# Patient Record
Sex: Female | Born: 1995 | Race: White | Hispanic: No | Marital: Single | Smoking: Never smoker
Health system: Southern US, Community
[De-identification: ages and names within clinical notes are randomized; demographics above are authoritative.]

## PROBLEM LIST (undated history)

## (undated) DIAGNOSIS — M419 Scoliosis, unspecified: Secondary | ICD-10-CM

## (undated) HISTORY — DX: Scoliosis, unspecified: M41.9

---
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
## 2016-01-03 ENCOUNTER — Ambulatory Visit (INDEPENDENT_AMBULATORY_CARE_PROVIDER_SITE_OTHER): Payer: BLUE CROSS/BLUE SHIELD | Admitting: Family

## 2016-01-03 ENCOUNTER — Encounter: Payer: Self-pay | Admitting: Family

## 2016-01-03 VITALS — Wt 167.0 lb

## 2016-01-03 DIAGNOSIS — B2790 Infectious mononucleosis, unspecified without complication: Secondary | ICD-10-CM | POA: Diagnosis not present

## 2016-01-03 MED ORDER — AMOXICILLIN 500 MG PO CAPS: 500.0000 mg | ORAL_CAPSULE | Freq: Two times a day (BID) | ORAL | Status: DC

## 2016-01-03 NOTE — Patient Instructions (Signed)
- Can start back exercise on Monday. Take it easy   - No contact activities  - If you develop fever, call immediately.  - Surfing is ok.   Infectious Mononucleosis Infectious mononucleosis is an infection caused by a virus. This illness is often called "mono." It causes symptoms that affect various areas of the body, including the throat, upper air passages, and lymph glands. The liver or spleen may also be affected. The virus spreads from person to person through close contact. The illness is usually not serious and often goes away in 2-4 weeks without treatment. In rare cases, symptoms can be more severe and last longer, sometimes up to several months. Because the illness can sometimes cause the liver or spleen to become enlarged, you should not participate in contact sports or strenuous exercise until your health care provider approves. CAUSES  Infectious mononucleosis is caused by the Epstein-Barr virus. This virus spreads through contact with an infected person's saliva or other bodily fluids. It is often spread through kissing. It may also spread through coughing or sharing utensils or drinking glasses that were recently used by an infected person. An infected person will not always appear ill but can still spread the virus. RISK FACTORS This illness is most common in adolescents and young adults. SIGNS AND SYMPTOMS  The most common symptoms of infectious mononucleosis are:  Sore throat.   Headache.   Fatigue.   Muscle aches.   Swollen glands.   Fever.   Poor appetite.   Enlarged liver or spleen.  Some less common symptoms that can also occur include:  Rash. This is more common if you take antibiotic medicines.  Feeling sick to your stomach (nauseous).   Abdominal pain.  DIAGNOSIS  Your health care provider will take your medical history and do a physical exam. Blood tests can be done to confirm the diagnosis.  TREATMENT  Infectious mononucleosis usually goes  away on its own with time. It cannot be cured with medicines, but medicines are sometimes used to relieve symptoms. Steroid medicine is sometimes needed if the swelling in the throat causes breathing or swallowing problems. Treatment in a hospital is sometimes needed for severe cases.  HOME CARE INSTRUCTIONS   Rest as needed.   Do not participate in contact sports, strenuous exercise, or heavy lifting until your health care provider approves. The liver and spleen could be seriously injured if they are enlarged from the illness. You may need to wait a couple months before participating in sports.   Drink enough fluid to keep your urine clear or pale yellow.   Do not drink alcohol.  Take medicines only as directed by your health care provider. Children under 1 years of age should not take aspirin because of the association with Reye syndrome.   Eat soft foods. Cold foods such as ice cream or frozen ice pops can soothe a sore throat.  If you have a sore throat, gargle with a mixture of salt and water. This may help relieve your discomfort. Mix 1 tsp of salt in 1 cup of warm water. Sucking on hard candy may also help.   Start regular activities gradually after the fever is gone. Be sure to rest when tired.   Avoid kissing or sharing utensils or drinking glasses until your health care provider tells you that you are no longer contagious.  PREVENTION  To avoid spreading the virus, do not kiss anyone or share utensils, drinking glasses, or food until your health care provider  tells you that you are no longer contagious. SEEK MEDICAL CARE IF:   Your fever is not gone after 10 days.  You have swollen lymph nodes that are not back to normal after 4 weeks.  Your activity level is not back to normal after 2 months.   You have yellow coloring to your eyes and skin (jaundice).  You have constipation.  SEEK IMMEDIATE MEDICAL CARE IF:   You have severe pain in the abdomen or  shoulder.  You are drooling.  You have trouble swallowing.  You have trouble breathing.  You develop a stiff neck.  You develop a severe headache.  You cannot stop throwing up (vomiting).  You have convulsions.  You are confused.  You have trouble with balance.  You have signs of dehydration. These may include:  Weakness.  Sunken eyes.  Pale skin.  Dry mouth.  Rapid breathing or pulse.   This information is not intended to replace advice given to you by your health care provider. Make sure you discuss any questions you have with your health care provider.   Document Released: 07/17/2000 Document Revised: 08/10/2014 Document Reviewed: 03/27/2014 Elsevier Interactive Patient Education [employer]2016 Elsevier Inc.

## 2016-01-03 NOTE — Progress Notes (Signed)
Subjective:     Patient ID: Brooke Leonard, female   DOB: 1996/02/25, 20 y.o.   MRN: [id number]  HPI 20 y.o. Female presents for follow up after being diagnosed with Mononucleosis two weeks ago. She states that she is feeling "so much better".  She reports that her energy is much better and she is not feeling congested at all. She has been resting and drinking lots of fluids, she is not playing Volleyball at this time. She denies fever, fatigue, SOB, abdominal pain and change in appetite. She leaves for [place] for three weeks on Saturday.    Review of Systems  Constitutional: Negative.  Negative for fever, diaphoresis, appetite change and fatigue.  HENT: Negative.  Negative for sinus pressure and sore throat.   Eyes: Negative.   Respiratory: Negative.  Negative for cough, chest tightness, shortness of breath and wheezing.   Cardiovascular: Negative.  Negative for chest pain and palpitations.  Gastrointestinal: Negative.  Negative for nausea, vomiting, abdominal pain, diarrhea, constipation and abdominal distention.  Endocrine: Negative.   Musculoskeletal: Negative.   Skin: Negative.   Neurological: Negative.  Negative for dizziness, weakness, light-headedness, numbness and headaches.  Hematological: Negative.  Negative for adenopathy.   Past Medical History  Diagnosis Date  . Scoliosis     5 degree curve    Social History   Social History  . Marital Status: Single    Spouse Name: N/A  . Number of Children: N/A  . Years of Education: N/A   Occupational History  . Not on file.   Social History Main Topics  . Smoking status: Never Smoker   . Smokeless tobacco: Never Used  . Alcohol Use: No  . Drug Use: No  . Sexual Activity: No   Other Topics Concern  . Not on file   Social History Narrative   [employer]   12th grade   Volleyball   Applying to college    No past surgical history on file.  Family History  Problem Relation Age of Onset  .  Diabetes Maternal Grandfather     type 1  . Heart disease Maternal Grandfather   . Heart disease Maternal Uncle     IHSS  . Early death Maternal Uncle 24  . Hypertrophic cardiomyopathy Maternal Uncle   . Hyperlipidemia Father   . Cancer Paternal Grandfather     colon, prostate, liver  . Asthma Neg Hx   . Depression Neg Hx   . Hypertension Neg Hx   . Kidney disease Neg Hx   . Thyroid disease Neg Hx   . Seizures Neg Hx   . Mental illness Neg Hx   . Alcohol abuse Neg Hx   . Drug abuse Neg Hx   . Arthritis Maternal Grandmother     No Known Allergies  Current Outpatient Prescriptions on File Prior to Visit  Medication Sig Dispense Refill  . amoxicillin (AMOXIL) 500 MG capsule Take 1 capsule (500 mg total) by mouth 2 (two) times daily. 20 capsule 0  . fluticasone (FLONASE) 50 MCG/ACT nasal spray Place 2 sprays into both nostrils daily. 16 g 2  . hydrOXYzine (ATARAX/VISTARIL) 25 MG tablet Take 1 tablet (25 mg total) by mouth 3 (three) times daily as needed. 30 tablet 0  . Ivermectin 0.5 % LOTN Apply 1 application topically once. 1 Tube 0  . silver sulfADIAZINE (SILVADENE) 1 % cream Apply 1 application topically daily. 50 g 2   No current facility-administered medications on file prior to  visit.    Wt 167 lb (75.751 kg)chart     Objective:   Physical Exam  Constitutional: She is active.  Neck: Trachea normal, normal range of motion and full passive range of motion without pain. Neck supple.  Cardiovascular: Normal rate, regular rhythm and normal pulses.   Pulmonary/Chest: Effort normal and breath sounds normal. She has no decreased breath sounds. She has no wheezes. She has no rhonchi. She has no rales.  Abdominal: Normal appearance and bowel sounds are normal. There is no hepatosplenomegaly, splenomegaly or hepatomegaly. There is no tenderness. There is no CVA tenderness.  Lymphadenopathy:    She has no cervical adenopathy.  Neurological: She is alert.  Skin: Skin is warm,  dry and intact.       Assessment:     Mononucleosis      Plan:     - May start back light activity. No contact.  - If develop fever, fatigue or abdominal pain needs to call clinic immediately.  - Tylenol or Ibuprofen for pain/fever

## 2017-01-02 ENCOUNTER — Encounter: Payer: Self-pay | Admitting: Pediatrics

## 2017-01-02 ENCOUNTER — Ambulatory Visit (INDEPENDENT_AMBULATORY_CARE_PROVIDER_SITE_OTHER): Payer: 59 | Admitting: Pediatrics

## 2017-01-02 VITALS — Wt 164.5 lb

## 2017-01-02 DIAGNOSIS — J019 Acute sinusitis, unspecified: Secondary | ICD-10-CM | POA: Diagnosis not present

## 2017-01-02 DIAGNOSIS — B9689 Other specified bacterial agents as the cause of diseases classified elsewhere: Secondary | ICD-10-CM | POA: Diagnosis not present

## 2017-01-02 MED ORDER — AMOXICILLIN 500 MG PO CAPS: 500.0000 mg | ORAL_CAPSULE | Freq: Two times a day (BID) | ORAL | 0 refills | Status: AC

## 2017-01-02 MED ORDER — HYDROXYZINE HCL 25 MG PO TABS: 25.0000 mg | ORAL_TABLET | Freq: Three times a day (TID) | ORAL | 0 refills | Status: AC | PRN

## 2017-01-02 NOTE — Patient Instructions (Signed)

## 2017-01-02 NOTE — Progress Notes (Signed)
21 year old female who presents for evaluation of cough and congestion for 16 days. Symptoms include: congestion, cough, mouth breathing, nasal congestion,  and snoring. Onset of symptoms was 16 days ago. Symptoms have been gradually worsening since that time.   The following portions of the patient's history were reviewed and updated as appropriate: allergies, current medications, past family history, past medical history, past social history, past surgical history and problem list.  Review of Systems Pertinent items are noted in HPI.    Objective:     General Appearance:    Alert, cooperative, no distress.  Head:    Normocephalic, without obvious abnormality, atraumatic  Eyes:    PERRL, conjunctiva/corneas clear  Ears:    Normal TM's and external ear canals, both ears with fluid in situ  Nose:   Nares normal, septum midline, mucosa red and swollen with mucoid drainage     Throat:   Lips, mucosa, and tongue normal; teeth and gums normal        Lungs:     Clear to auscultation bilaterally, respirations unlabored  Chest wall:    No tenderness or deformity  Heart:    Regular rate and rhythm, S1 and S2 normal, no murmur, rub   or gallop  Abdomen:     Soft, non-tender, bowel sounds active all four quadrants,    no masses, no organomegaly        Extremities:   Extremities normal, atraumatic, no cyanosis or edema     Skin:   Skin color, texture, turgor normal, no rashes or lesions  Lymph nodes:   Cervical, supraclavicular, and axillary nodes normal  Neurologic:   Normal strength, sensation and reflexes      throughout      Assessment:    Acute bacterial sinusitis.    Plan:    Nasal saline sprays. Antihistamines per medication orders. Amoxicillin per medication orders.

## 2017-01-03 DIAGNOSIS — J019 Acute sinusitis, unspecified: Principal | ICD-10-CM

## 2017-01-03 DIAGNOSIS — B9689 Other specified bacterial agents as the cause of diseases classified elsewhere: Secondary | ICD-10-CM | POA: Insufficient documentation

## 2017-03-23 ENCOUNTER — Telehealth: Payer: Self-pay | Admitting: Pediatrics

## 2017-03-23 NOTE — Telephone Encounter (Signed)
Brooke Leonard passed out Friday  And would like to talk to you again about her low sugar problems please

## 2017-03-24 NOTE — Telephone Encounter (Signed)
Spoke to patient and  Advised her that there are other causes of fainting other than low blood sugar. She has to make sure her salt intake is good and she is drinking a lot of fluids to keep hydrated. She can come in if these episodes are more frequent (she only had this second one in 5 years) so we can check blood pressure and run any needed blood tests. She expressed understanding.

## 2020-03-07 NOTE — ED Notes (Signed)
ED Triage Note       ED Secondary Triage Entered On:  03/07/2020 22:46 EDT    Performed On:  03/07/2020 22:45 EDT by Claretha Cooper, RN, Noberto Retort               General Information   Barriers to Learning :   None evident   ED Home Meds Section :   Document assessment   [facility] ED Fall Risk Section :   Document assessment   ED Advance Directives Section :   Document assessment   ED Palliative Screen :   N/A (prefilled for <65yo)   Claretha Cooper, RN, Erin K - 03/07/2020 22:45 EDT   (As Of: 03/07/2020 22:46:20 EDT)   Diagnoses(Active)    Abdominal pain  Date:   03/07/2020 ; Diagnosis Type:   Reason For Visit ; Confirmation:   Complaint of ; Clinical Dx:   Abdominal pain ; Classification:   Medical ; Clinical Service:   Emergency medicine ; Code:   PNED ; Probability:   0 ; Diagnosis Code:   [id number]             -    Procedure History   (As Of: 03/07/2020 22:46:20 EDT)     Phoebe Perch Fall Risk Assessment Tool   Hx of falling last 3 months ED Fall :   No   Patient confused or disoriented ED Fall :   No   Patient intoxicated or sedated ED Fall :   No   Patient impaired gait ED Fall :   No   Use a mobility assistance device ED Fall :   No   Patient altered elimination ED Fall :   No   [facility] ED Fall Score :   0    Aviva Kluver - 03/07/2020 22:45 EDT   ED Advance Directive   Advance Directive :   Yes   Type of Advance Directive :   Medical durable power of attorney   Aviva Kluver - 03/07/2020 22:45 EDT

## 2020-03-07 NOTE — ED Provider Notes (Signed)
Abdominal Pain *ED        Patient:   Debbie Farley, Debbie Farley             MRN: [id number]            FIN: [id number]               Age:   24 years     Sex:  Female     DOB:  10/15/1995   Associated Diagnoses:   Pyelonephritis   Author:   Benay Pillow A-MD      Basic Information   Time seen: Provider Seen (ST)   ED Provider/Time:    Benay Pillow A-MD / 03/07/2020 22:40  .   Additional information: Chief Complaint from Nursing Triage Note   Chief Complaint  Chief Complaint: pt reports left sided abdominal pain for the past 3 days- denies vomiting and diarrhea (03/07/20 22:41:00).      History of Present Illness   Patient complains of 3 days of left-sided abdominal pain described as sharp.  It has been gradual and radiates to the left pelvis the left flank.  She denies any dysuria or hematuria.  She has had a small amount of vaginal bleeding with her last menstrual cycle about a week ago.  She denies any fevers or chills.  No chest pain or shortness of breath.  She is been constipated with no other bowel habit changes.  She denies any history of ovarian cyst.  She has been on stable birth control.  Not alleviated with anti-inflammatories.  Moderate severity.        Review of Systems             Additional review of systems information: All other systems reviewed and otherwise negative.      Health Status   Allergies:    Allergic Reactions (All)  No Known Allergies.   Medications:  (Selected)   Inpatient Medications  Ordered  Toradol: 15 mg, 1 mL, IV Push, Once  Zofran: 4 mg, 2 mL, IV Push, Once  Documented Medications  Documented  Nikki 3 mg-0.02 mg oral tablet: tabs, Oral, Daily, 0 Refill(s).      Past Medical/ Family/ Social History   Medical history: Reviewed as documented in chart.   Surgical history: Reviewed as documented in chart.   Family history: Not significant.   Social history: Reviewed as documented in chart.   Problem list:    No qualifying data available  .      Physical Examination               Vital Signs   Vital  Signs   11/01/9620 29:79 EDT Systolic Blood Pressure 892 mmHg    Diastolic Blood Pressure 85 mmHg    Temperature Oral 37.1 degC    Heart Rate Monitored 89 bpm    Respiratory Rate 16 br/min    SpO2 99 %   .   Measurements   03/07/2020 22:45 EDT Body Mass Index est meas 21.86 kg/m2    Body Mass Index Measured 21.86 kg/m2   03/07/2020 22:41 EDT Height/Length Measured 184 cm    Weight Dosing 74 kg   .   Basic Oxygen Information   03/07/2020 22:41 EDT Oxygen Therapy Room air    SpO2 99 %   .   General:  Alert, no acute distress.    Skin:  Warm, dry.    Head:  Normocephalic, atraumatic.    Eye:  Extraocular movements are intact, normal conjunctiva.  Cardiovascular:  Regular rate and rhythm, No murmur, Normal peripheral perfusion.    Respiratory:  Lungs are clear to auscultation, respirations are non-labored, breath sounds are equal.    Gastrointestinal:  Soft, Non distended, Normal bowel sounds, Mild tenderness along the left pelvis.  No rebound or guarding.    Musculoskeletal:  Gait normal.  No peripheral edema.   Neurological:  Alert and oriented to person, place, time, and situation.   Psychiatric:  Cooperative, appropriate mood & affect.       Medical Decision Making   Documents reviewed:  Emergency department nurses' notes, flowsheet, vital signs.    Results review:  Lab results : Lab View   03/07/2020 23:57 EDT Estimated Creatinine Clearance 128.86 mL/min   03/07/2020 23:30 EDT WBC 13.1 x10e3/mcL  HI    RBC 4.86 x10e6/mcL    Hgb 12.9 g/dL    HCT 40.0 %    MCV 82.3 fL    MCH 26.5 pg  LOW    MCHC 32.3 g/dL    RDW 12.5 %    Platelet 276 x10e3/mcL    MPV 8.8 fL    NRBC Absolute Auto 0.000 x10e3/mcL    NRBC Percent Auto 0.0 %    UA Color Yellow    UA Appear Slightly Cloudy    UA Glucose Negative    UA Bili Negative    UA Ketones Negative    UA Spec Grav 1.025    UA Blood Large    UA pH 6.5    Protein U 100    UA Urobilinogen 0.2 EU/dL    UA Nitrite Positive    UA Leuk Est Large    WBC U 21-50 /HPF    RBC U 6-10 /HPF    Sq Epi U  Rare /LPF    Bacteria U Moderate    Mucus U Few /LPF    Amorph U Rare /HPF    Sodium Lvl 137 mmol/L    Potassium Lvl 4.0 mmol/L    Chloride 104 mmol/L    CO2 22 mmol/L    Glucose Random 119 mg/dL  HI    BUN 12 mg/dL    Creatinine Lvl 0.8 mg/dL    AGAP 11 mmol/L    Osmolality Calc 274 mOsm/kg    Calcium Lvl 9.4 mg/dL    Protein Total 7.4 g/dL    Albumin Lvl 3.9 g/dL    Globulin Calc 3.5 g/dL    AG Ratio Calc 1.11 mmol/L    Alk Phos 99 unit/L    AST 21 unit/L    ALT 15 unit/L    eGFR AA 121 mL/min/1.70m???    eGFR Non-AA 105 mL/min/1.769m??    Bili Total <0.15 mg/dL   03/07/2020 23:29 EDT Preg U POC Negative   .   Radiology results:  Transvaginal ultrasound negative for acute pathology, report from radiologist shows negative for any signs of torsion.      Reexamination/ Reevaluation   Notes: Patient is doing well here.  She feels well.  She now states that she was treated for UTI few weeks ago with 2 different rounds of antibiotics including Macrobid.      Impression and Plan   Diagnosis   Pyelonephritis (ICD10-CM N12, Discharge, Medical)   Plan   Condition: Improved.    Disposition: Discharged: to home.    Prescriptions: Launch prescriptions   Pharmacy:  ondansetron 4 mg oral tablet (Prescribe): 4 mg, 1 tabs, Oral, q6hr, for 3 days, PRN: as needed for nausea/vomiting, 20 tabs,  0 Refill(s)  cefdinir 300 mg oral capsule (Prescribe): 300 mg, 1 caps, Oral, q12hr, for 10 days, 20 caps, 0 Refill(s)  Anaprox-DS 550 mg oral tablet (Prescribe): 550 mg, 1 tabs, Oral, BID, for 5 days, PRN: mild pain (1-3), 20 tabs, 0 Refill(s).    Patient was given the following educational materials: Pyelonephritis, Adult, Easy-to-Read.    Follow up with: Follow up with primary care provider Within 1 to 2 days Take the antibiotics as directed.  Drink plenty of fluids.  Please follow-up your primary doctor to find out the final urine culture results to make sure you are on the correct antibiotic.    Return to the ER immediately for any uncontrolled  pain, new or worsening fever, uncontrolled vomiting, any other concerns.    Notes: Patient presents with left pelvis pain rating towards the left flank.  Her ultrasound was negative for cyst or ovarian torsion.  Labs show leukocytosis with clear UTI consistent with pyelonephritis.  She as it turns out was recently treated for UTI.  She was given IV Rocephin here will start her on cephalosporin pending her urine culture results.  She looks well here and is reasonable for outpatient management.  She is here with her significant other who are comfortable with the plan and will be staying here locally for the next few days.  I advised her of pending urine culture results.    Signature Line     Electronically Signed on 03/08/2020 01:00 AM EDT   ________________________________________________   Benay Pillow A-MD               Modified by: Benay Pillow A-MD on 03/08/2020 01:00 AM EDT

## 2020-03-07 NOTE — ED Notes (Signed)
ED Triage Note       ED Triage Adult Entered On:  03/07/2020 22:45 EDT    Performed On:  03/07/2020 22:41 EDT by Claretha Cooper, RN, Erin K               Triage   Numeric Rating Pain Scale :   7   Chief Complaint :   pt reports left sided abdominal pain for the past 3 days- denies vomiting and diarrhea   [place] Mode of Arrival :   Private vehicle   Infectious Disease Documentation :   Document assessment   Temperature Oral :   37.1 degC(Converted to: 98.8 degF)    Heart Rate Monitored :   89 bpm   Respiratory Rate :   16 br/min   Systolic Blood Pressure :   139 mmHg   Diastolic Blood Pressure :   85 mmHg   SpO2 :   99 %   Oxygen Therapy :   Room air   Patient presentation :   None of the above   Chief Complaint or Presentation suggest infection :   Yes   Weight Dosing :   74 kg(Converted to: 163 lb 2 oz)    Height :   184 cm(Converted to: 6 ft 0 in)    Body Mass Index Dosing :   22 kg/m2   Aviva Kluver - 03/07/2020 22:41 EDT   DCP GENERIC CODE   Tracking Acuity :   3   Tracking Group :   ED [employer]   [place], RN, Noberto Retort - 03/07/2020 22:41 EDT   ED General Section :   Document assessment   Pregnancy Status :   Patient denies   Last Menstrual Period :   02/27/2020 EDT   ED Allergies Section :   Document assessment   ED Reason for Visit Section :   Document assessment   ED Home Meds Section :   Document assessment   ED Quick Assessment :   Patient appears awake, alert, oriented to baseline. Skin warm and dry. Moves all extremities. Respiration even and unlabored. Appears in no apparent distress.   Claretha Cooper, RN, Denny Peon K - 03/07/2020 22:41 EDT   ID Risk Screen Symptoms   Recent Travel History :   No recent travel   Close Contact with COVID-19 ID :   No   Last 14 days COVID-19 ID :   No   TB Symptom Screen :   No symptoms   C. diff Symptom/History ID :   Neither of the above   Claretha Cooper, [id number] - 03/07/2020 22:41 EDT   Allergies   (As Of: 03/07/2020 22:45:52 EDT)   Allergies (Active)   No Known Allergies  Estimated Onset Date:    Unspecified ; Created ByClaretha Cooper, RN, Erin K; Reaction Status:   Active ; Category:   Drug ; Substance:   No Known Allergies ; Type:   Allergy ; Updated By:   Claretha Cooper, RN, Noberto Retort; Reviewed Date:   03/07/2020 22:42 EDT        Psycho-Social   Last 3 mo, thoughts killing self/others :   Patient denies   Right click within box for Suspected Abuse policy link. :   None   Feels Safe Where Live :   Yes   Aviva Kluver - 03/07/2020 22:41 EDT   ED Home Med List   Medication List   (As Of: 03/07/2020 22:45:52 EDT)  Home Meds    drospirenone-ethinyl estradiol  :   drospirenone-ethinyl estradiol ; Status:   Documented ; Ordered As Mnemonic:   Nikki 3 mg-0.02 mg oral tablet ; Simple Display Line:   tabs, Oral, Daily, 0 Refill(s) ; Catalog Code:   drospirenone-ethinyl estradiol ; Order Dt/Tm:   03/07/2020 22:45:29 EDT            ED Reason for Visit   (As Of: 03/07/2020 22:45:52 EDT)   Diagnoses(Active)    Abdominal pain  Date:   03/07/2020 ; Diagnosis Type:   Reason For Visit ; Confirmation:   Complaint of ; Clinical Dx:   Abdominal pain ; Classification:   Medical ; Clinical Service:   Emergency medicine ; Code:   PNED ; Probability:   0 ; Diagnosis Code:   [id number]

## 2020-03-08 LAB — URINALYSIS WITH REFLEX TO CULTURE
Bilirubin Urine: NEGATIVE
Glucose, UA: NEGATIVE mg/dL
Ketones, Urine: NEGATIVE mg/dL
Nitrite, Urine: POSITIVE — AB
Protein, UA: 100 — AB
Specific Gravity, UA: 1.025 (ref 1.003–1.035)
Urobilinogen, Urine: 0.2 EU/dL
pH, UA: 6.5 (ref 4.5–8.0)

## 2020-03-08 LAB — COMPREHENSIVE METABOLIC PANEL
ALT: 15 U/L (ref 0–33)
AST: 21 U/L (ref 0–32)
Albumin/Globulin Ratio: 1.11 mmol/L (ref 1.00–2.00)
Albumin: 3.9 g/dL (ref 3.5–5.2)
Alk Phosphatase: 99 U/L (ref 35–117)
Anion Gap: 11 mmol/L (ref 2–17)
BUN: 12 mg/dL (ref 6–20)
CO2: 22 mmol/L (ref 22–29)
Calcium: 9.4 mg/dL (ref 8.6–10.0)
Chloride: 104 mmol/L (ref 98–107)
Creatinine: 0.8 mg/dL (ref 0.5–1.0)
GFR African American: 121 mL/min/{1.73_m2} (ref >=90)
GFR Non-African American: 105 mL/min/{1.73_m2} (ref >=90)
Globulin: 3.5 g/dL (ref 1.9–4.4)
Glucose: 119 mg/dL — ABNORMAL HIGH (ref 70–99)
OSMOLALITY CALCULATED: 274 mOsm/kg (ref 270–287)
Potassium: 4 mmol/L (ref 3.5–5.3)
Sodium: 137 mmol/L (ref 135–145)
Total Bilirubin: <0.15 mg/dL (ref 0.00–1.20)
Total Protein: 7.4 g/dL (ref 6.4–8.3)

## 2020-03-08 LAB — CBC
Hematocrit: 40 % (ref 34.0–47.0)
Hemoglobin: 12.9 g/dL (ref 11.5–15.7)
MCH: 26.5 pg — ABNORMAL LOW (ref 27.0–34.5)
MCHC: 32.3 g/dL (ref 32.0–36.0)
MCV: 82.3 fL (ref 81.0–99.0)
MPV: 8.8 fL (ref 7.2–13.2)
NRBC Absolute: 0 10*3/uL (ref 0.000–0.012)
NRBC Automated: 0 % (ref 0.0–0.2)
Platelets: 276 10*3/uL (ref 140–440)
RBC: 4.86 x10e6/mcL (ref 3.60–5.20)
RDW: 12.5 % (ref 11.0–16.0)
WBC: 13.1 10*3/uL — ABNORMAL HIGH (ref 3.8–10.6)

## 2020-03-08 LAB — POC PREGNANCY UR-QUAL: Preg Test, Ur: NEGATIVE

## 2020-03-08 NOTE — ED Notes (Signed)
ED Results Callback Log     Urine Culture  Collected: 03/07/2020 EC  Complete Body site:   Specimen Type: U CleanCatch 03/10/2020 09:28      03/10/2020 10:49 Dimas Chyle, RN, Darl Pikes) No further action required Urine culture came back positive, sensitive to cefazolin. Pt prescribed cefdinr.

## 2020-03-08 NOTE — ED Notes (Signed)
 ED Patient Summary       ;       [facility] Emergency Department  [address], [place], [place] [address]  [phone]  Discharge Instructions (Patient)  _______________________________________     Name: Debbie Farley, Debbie Farley  DOB:  [birth year]                   MRN: [id number]                   FIN: [id number]  Reason For Visit: Abdominal pain; UPPER ABD PAIN  Final Diagnosis: Pyelonephritis     Visit Date: 03/07/2020 22:37:00  Address: [address] [place] [address]  Phone: [phone]     Emergency Department Providers:         Primary Physician:   REATHA SELINDA LABOR         [facility] would like to thank you for allowing us  to assist you with your healthcare needs. The following includes patient education materials and information regarding your injury/illness.     Follow-up Instructions:  You were seen today on an emergency basis. Please contact your primary care doctor for a follow up appointment. If you received a referral to a specialist doctor, it is important you follow-up as instructed.    It is important that you call your follow-up doctor to schedule and confirm the location of your next appointment. Your doctor may practice at multiple locations. The office location of your follow-up appointment may be different to the one written on your discharge instructions.    If you do not have a primary care doctor, please call [phone] for help in finding a [facility] Provider. For help in finding a specialist doctor, please call [phone].    If your condition gets worse before your follow-up with your primary care doctor or specialist, please return to the Emergency Department.      Coronavirus 2019 (COVID-19) Reminders:     Patients age 23 - 100, with parental consent, and patients over age 4 can make an appointment for a COVID-19 vaccine. Patients can contact their [facility] doctors' offices to schedule an appointment to receive the COVID-19  vaccine. Patients who do not have a [facility] physician can call [phone] to schedule vaccination appointments.      Follow Up Appointments:  Primary Care Provider:      Name: PCP,  NONE      Phone:                  With: Address: When:   Follow up with primary care provider  Within 1 to 2 days   Comments:   Take the antibiotics as directed. Drink plenty of fluids. Please follow-up your primary doctor to find out the final urine culture results to make sure you are on the correct antibiotic.     Return to the ER immediately for any uncontrolled pain, new or worsening fever, uncontrolled vomiting, any other concerns              Printed Prescriptions:    Patient Education Materials:  Discharge Orders          Discharge Patient 03/08/20 1:00:00 EDT         Comment:      Pyelonephritis, Adult, Easy-to-Read     Pyelonephritis, Adult    Pyelonephritis is a kidney infection. The kidneys are organs that help clean your blood by  moving waste out of your blood and into your pee (urine). This infection can happen quickly, or it can last for a long time. In most cases, it clears up with treatment and does not cause other problems.      HOME CARE    Medicines     Take over-the-counter and prescription medicines only as told by your doctor.     Take your antibiotic medicine as told by your doctor. Do not stop taking the medicine even if you start to feel better.    General Instructions     Drink enough fluid to keep your pee clear or pale yellow.     Avoid caffeine, tea, and carbonated drinks.     Pee (urinate) often. Avoid holding in pee for long periods of time.     Pee before and after sex.     After pooping (having a bowel movement), women should wipe from front to back. Use each tissue only once.     Keep all follow-up visits as told by your doctor. This is important.    GET HELP IF:     You do not feel better after 2 days.     Your symptoms get worse.     You have a fever.    GET HELP RIGHT AWAY IF:     You  cannot take your medicine or drink fluids as told.     You have chills and shaking.     You throw up (vomit).     You have very bad pain in your side (flank) or back.     You feel very weak or you pass out (faint).    This information is not intended to replace advice given to you by your health care provider. Make sure you discuss any questions you have with your health care provider.    Document Released: 08/27/2004 Document Revised: 04/10/2015 Document Reviewed: 11/12/2014  Elsevier Interactive Patient Education ?2016 Elsevier Inc.         Allergy Info: No Known Allergies     Medication Information:  [facility] Physicians provided you with a complete list of medications post discharge, if you have been instructed to stop taking a medication please ensure you also follow up with this information to your Primary Care Physician.  Unless otherwise noted, patient will continue to take medications as prescribed prior to the Emergency Room visit.  Any specific questions regarding your chronic medications and dosages should be discussed with your physician(s) and pharmacist.          cefdinir (cefdinir 300 mg oral capsule) 1 Capsules Oral (given by mouth) every 12 hours for 10 Days. Refills: 0.  drospirenone-ethinyl estradiol (Nikki 3 mg-0.02 mg oral tablet) Oral (given by mouth) every day.  naproxen (Anaprox-DS 550 mg oral tablet) 1 Tabs Oral (given by mouth) 2 times a day as needed mild pain (1-3) for 5 Days. Refills: 0.  ondansetron (ondansetron 4 mg oral tablet) 1 Tabs Oral (given by mouth) every 6 hours as needed as needed for nausea/vomiting for 3 Days. Refills: 0.      Medications Administered During Visit:              Medication Dose Route   ketorolac 15 mg IV Push   ondansetron 4 mg IV Push   ceftriaxone 2 g IV Piggyback          Major Tests and Procedures:  The following procedures and tests were performed during your ED visit.  COMMON PROCEDURES%>  COMMON PROCEDURES COMMENTS%>          Laboratory  Orders  Name Status Details   .Preg U POC Completed Urine, RT, RT - Routine, Collected, 03/07/20 23:29:00 EDT, Nurse collect, 03/07/20 23:29:00 US Robinette, RAL POC Login   C Urine Ordered Urine, Clean Catch, Stat, ST - Stat, Collected, 03/07/20 23:30:00 EDT, 03/07/20 23:30:00 EDT, Nurse collect, 03/07/20 23:30:00 EDT, [id number]   CBC Completed Blood, Stat, ST - Stat, 03/07/20 23:09:00 EDT, 03/07/20 23:09:00 EDT, Nurse collect, REATHA MAYO A-MD, Print label Y/N   CMP Completed Blood, Stat, ST - Stat, 03/07/20 23:09:00 EDT, 03/07/20 23:09:00 EDT, Nurse collect, REATHA MAYO A-MD, Print label Y/N   UA Mic wRflx Cul Completed Urine, Clean Catch, Stat, ST - Stat, 03/07/20 23:20:00 EDT, 03/07/20 23:20:00 EDT, Nurse collect   [place] SST Completed Blood, Stat, ST - Stat, Collected, 03/07/20 23:30:00 EDT [id number], 03/07/20 23:30:00 EDT, Nurse collect, Venous Draw, 03/07/20 23:42:00 EDT, RH CP Login, CURRY,  JASON A-MD, Print label Y/N, rh_accession_2, 4 mL DDU/*[id number]*/, Complete               Radiology Orders  Name Status Details   US  Transvaginal Non OB Ordered 03/07/20 23:09:00 EDT, STAT 1 hour or less, Reason: Pain, Pelvic, Transport Mode: STRETCHER, pp_set_radiology_subspecialty   US  Vascular Flow Abd/Pelvis/Scrotum Lmtd Ordered 03/07/20 23:09:00 EDT, STAT 1 hour or less, Reason: Torsion of ovary, ovarian pedicle and fallopian tube, Transport Mode: STRETCHER, pp_set_radiology_subspecialty               Patient Care Orders  Name Status Details   Discharge Patient Ordered 03/08/20 1:00:00 EDT   ED Assessment Adult Completed 03/07/20 22:45:52 EDT, 03/07/20 22:45:52 EDT   ED Secondary Triage Completed 03/07/20 22:45:52 EDT, 03/07/20 22:45:52 EDT   ED Triage Adult Completed 03/07/20 22:37:19 EDT, 03/07/20 22:37:19 EDT   POC-Urine Dipstick collect Completed 03/07/20 23:00:00 EDT, Once, 03/07/20 23:00:00 EDT   POC-Urine Pregnancy Test collect Completed 03/07/20 23:00:00 EDT, Once, 03/07/20 23:00:00 EDT   Saline Lock  Insert Completed 03/07/20 23:09:00 EDT, Once, 03/07/20 23:09:00 EDT       ---------------------------------------------------------------------------------------------------------------------  [facility]) encourages you to self-enroll in the [facility] Patient Portal.  [facility] Patient Portal will allow you to manage your personal health information securely from your own electronic device now and in the future.  To begin your Patient Portal enrollment process, please visit [email or web address] Click on "Sign up now" under [facility].  If you find that you need additional assistance on the [facility] Patient Portal or need a copy of your medical records, please call the [facility] Medical Records Office at [phone].  Comment:

## 2020-03-08 NOTE — ED Notes (Signed)
ED Patient Education Note     Patient Education Materials Follows:  Easy-to-Read     Pyelonephritis, Adult    Pyelonephritis is a kidney infection. The kidneys are organs that help clean your blood by moving waste out of your blood and into your pee (urine). This infection can happen quickly, or it can last for a long time. In most cases, it clears up with treatment and does not cause other problems.      HOME CARE    Medicines     Take over-the-counter and prescription medicines only as told by your doctor.     Take your antibiotic medicine as told by your doctor. Do not stop taking the medicine even if you start to feel better.    General Instructions     Drink enough fluid to keep your pee clear or pale yellow.     Avoid caffeine, tea, and carbonated drinks.     Pee (urinate) often. Avoid holding in pee for long periods of time.     Pee before and after sex.     After pooping (having a bowel movement), women should wipe from front to back. Use each tissue only once.     Keep all follow-up visits as told by your doctor. This is important.    GET HELP IF:     You do not feel better after 2 days.     Your symptoms get worse.     You have a fever.    GET HELP RIGHT AWAY IF:     You cannot take your medicine or drink fluids as told.     You have chills and shaking.     You throw up (vomit).     You have very bad pain in your side (flank) or back.     You feel very weak or you pass out (faint).    This information is not intended to replace advice given to you by your health care provider. Make sure you discuss any questions you have with your health care provider.    Document Released: 08/27/2004 Document Revised: 04/10/2015 Document Reviewed: 11/12/2014  Elsevier Interactive Patient Education ?2016 Elsevier Inc.

## 2020-03-08 NOTE — Discharge Summary (Signed)
 ED Clinical Summary                        [facility]  [address]  [place], [place], [address]  [phone]           PERSON INFORMATION  Name: Debbie Farley, Debbie Farley Age:  24 Years DOB: 05-01-1996   Sex: Female Language: English PCP: PCP,  NONE   Marital Status: Single Phone: [phone] Med Service: MED-Medicine   MRN: [id number] Acct# [id number] Arrival: 03/07/2020 22:37:00   Visit Reason: Abdominal pain; UPPER ABD PAIN Acuity: 3 LOS: 000 03:22   Address:    [address] [place] [address]   Diagnosis:    Pyelonephritis  Medications:          New Medications  Printed Prescriptions  cefdinir (cefdinir 300 mg oral capsule) 1 Capsules Oral (given by mouth) every 12 hours for 10 Days. Refills: 0.  Last Dose:____________________  naproxen (Anaprox-DS 550 mg oral tablet) 1 Tabs Oral (given by mouth) 2 times a day as needed mild pain (1-3) for 5 Days. Refills: 0.  Last Dose:____________________  ondansetron (ondansetron 4 mg oral tablet) 1 Tabs Oral (given by mouth) every 6 hours as needed as needed for nausea/vomiting for 3 Days. Refills: 0.  Last Dose:____________________  Medications that have not changed  Other Medications  drospirenone-ethinyl estradiol (Nikki 3 mg-0.02 mg oral tablet) Oral (given by mouth) every day.  Last Dose:____________________      Medications Administered During Visit:                Medication Dose Route   ketorolac 15 mg IV Push   ondansetron 4 mg IV Push   ceftriaxone 2 g IV Piggyback               Allergies      No Known Allergies      Major Tests and Procedures:  The following procedures and tests were performed during your ED visit.  COMMON PROCEDURES%>  COMMON PROCEDURES COMMENTS%>                PROVIDER INFORMATION               Provider Role Assigned Sampson ICE, SELINDA A-MD ED Provider 03/07/2020 22:40:40    Janit, RN, Avis R ED Nurse 03/07/2020 23:05:08        Attending Physician:  ICE SELINDA A-MD      Admit Doc  ICE SELINDA A-MD     Consulting Doc       VITALS  INFORMATION  Vital Sign Triage Latest   Temp Oral ORAL_1%> ORAL%>   Temp Temporal TEMPORAL_1%> TEMPORAL%>   Temp Intravascular INTRAVASCULAR_1%> INTRAVASCULAR%>   Temp Axillary AXILLARY_1%> AXILLARY%>   Temp Rectal RECTAL_1%> RECTAL%>   02 Sat 99 % 98 %   Respiratory Rate RATE_1%> RATE%>   Peripheral Pulse Rate PULSE RATE_1%> PULSE RATE%>   Apical Heart Rate HEART RATE_1%> HEART RATE%>   Blood Pressure BLOOD PRESSURE_1%>/ BLOOD PRESSURE_1%>85 mmHg BLOOD PRESSURE%> / BLOOD PRESSURE%>77 mmHg                 Immunizations      No Immunizations Documented This Visit          DISCHARGE INFORMATION   Discharge Disposition: H Outpt-Sent Home   Discharge Location:  Home   Discharge Date and Time:  03/08/2020 01:59:29   ED Checkout Date and Time:  03/08/2020 01:59:29  DEPART REASON INCOMPLETE INFORMATION               Depart Action Incomplete Reason   Interactive View/I&O Recently assessed               Problems      No Problems Documented              Smoking Status      No Smoking Status Documented         PATIENT EDUCATION INFORMATION  Instructions:     Pyelonephritis, Adult, Easy-to-Read     Follow up:                   With: Address: When:   Follow up with primary care provider  Within 1 to 2 days   Comments:   Take the antibiotics as directed. Drink plenty of fluids. Please follow-up your primary doctor to find out the final urine culture results to make sure you are on the correct antibiotic.     Return to the ER immediately for any uncontrolled pain, new or worsening fever, uncontrolled vomiting, any other concerns              ED PROVIDER DOCUMENTATION     Patient:   Debbie Farley, Debbie Farley             MRN: [id number]            FIN: [id number]               Age:   24 years     Sex:  Female     DOB:  05-Aug-1995   Associated Diagnoses:   Pyelonephritis   Author:   REATHA MAYO A-MD      Basic Information   Time seen: Provider Seen (ST)   ED Provider/Time:    REATHA MAYO A-MD / 03/07/2020 22:40  .   Additional information: Chief  Complaint from Nursing Triage Note   Chief Complaint  Chief Complaint: pt reports left sided abdominal pain for the past 3 days- denies vomiting and diarrhea (03/07/20 22:41:00).      History of Present Illness   Patient complains of 3 days of left-sided abdominal pain described as sharp.  It has been gradual and radiates to the left pelvis the left flank.  She denies any dysuria or hematuria.  She has had a small amount of vaginal bleeding with her last menstrual cycle about a week ago.  She denies any fevers or chills.  No chest pain or shortness of breath.  She is been constipated with no other bowel habit changes.  She denies any history of ovarian cyst.  She has been on stable birth control.  Not alleviated with anti-inflammatories.  Moderate severity.        Review of Systems             Additional review of systems information: All other systems reviewed and otherwise negative.      Health Status   Allergies:    Allergic Reactions (All)  No Known Allergies.   Medications:  (Selected)   Inpatient Medications  Ordered  Toradol: 15 mg, 1 mL, IV Push, Once  Zofran: 4 mg, 2 mL, IV Push, Once  Documented Medications  Documented  Nikki 3 mg-0.02 mg oral tablet: tabs, Oral, Daily, 0 Refill(s).      Past Medical/ Family/ Social History   Medical history: Reviewed as documented in chart.   Surgical history: Reviewed as documented in  chart.   Family history: Not significant.   Social history: Reviewed as documented in chart.   Problem list:    No qualifying data available  .      Physical Examination               Vital Signs   Vital Signs   03/07/2020 22:41 EDT Systolic Blood Pressure 139 mmHg    Diastolic Blood Pressure 85 mmHg    Temperature Oral 37.1 degC    Heart Rate Monitored 89 bpm    Respiratory Rate 16 br/min    SpO2 99 %   .   Measurements   03/07/2020 22:45 EDT Body Mass Index est meas 21.86 kg/m2    Body Mass Index Measured 21.86 kg/m2   03/07/2020 22:41 EDT Height/Length Measured 184 cm    Weight Dosing 74 kg    .   Basic Oxygen Information   03/07/2020 22:41 EDT Oxygen Therapy Room air    SpO2 99 %   .   General:  Alert, no acute distress.    Skin:  Warm, dry.    Head:  Normocephalic, atraumatic.    Eye:  Extraocular movements are intact, normal conjunctiva.    Cardiovascular:  Regular rate and rhythm, No murmur, Normal peripheral perfusion.    Respiratory:  Lungs are clear to auscultation, respirations are non-labored, breath sounds are equal.    Gastrointestinal:  Soft, Non distended, Normal bowel sounds, Mild tenderness along the left pelvis.  No rebound or guarding.    Musculoskeletal:  Gait normal.  No peripheral edema.   Neurological:  Alert and oriented to person, place, time, and situation.   Psychiatric:  Cooperative, appropriate mood & affect.       Medical Decision Making   Documents reviewed:  Emergency department nurses' notes, flowsheet, vital signs.    Results review:  Lab results : Lab View   03/07/2020 23:57 EDT Estimated Creatinine Clearance 128.86 mL/min   03/07/2020 23:30 EDT WBC 13.1 x10e3/mcL  HI    RBC 4.86 x10e6/mcL    Hgb 12.9 g/dL    HCT 59.9 %    MCV 17.6 fL    MCH 26.5 pg  LOW    MCHC 32.3 g/dL    RDW 87.4 %    Platelet 276 x10e3/mcL    MPV 8.8 fL    NRBC Absolute Auto 0.000 x10e3/mcL    NRBC Percent Auto 0.0 %    UA Color Yellow    UA Appear Slightly Cloudy    UA Glucose Negative    UA Bili Negative    UA Ketones Negative    UA Spec Grav 1.025    UA Blood Large    UA pH 6.5    Protein U 100    UA Urobilinogen 0.2 EU/dL    UA Nitrite Positive    UA Leuk Est Large    WBC U 21-50 /HPF    RBC U 6-10 /HPF    Sq Epi U Rare /LPF    Bacteria U Moderate    Mucus U Few /LPF    Amorph U Rare /HPF    Sodium Lvl 137 mmol/L    Potassium Lvl 4.0 mmol/L    Chloride 104 mmol/L    CO2 22 mmol/L    Glucose Random 119 mg/dL  HI    BUN 12 mg/dL    Creatinine Lvl 0.8 mg/dL    AGAP 11 mmol/L    Osmolality Calc 274 mOsm/kg    Calcium Lvl 9.4  mg/dL    Protein Total 7.4 g/dL    Albumin Lvl 3.9 g/dL    Globulin Calc 3.5 g/dL     AG Ratio Calc 8.88 mmol/L    Alk Phos 99 unit/L    AST 21 unit/L    ALT 15 unit/L    eGFR AA 121 mL/min/1.75m    eGFR Non-AA 105 mL/min/1.25m    Bili Total <0.15 mg/dL   08/06/7976 76:70 EDT Preg U POC Negative   .   Radiology results:  Transvaginal ultrasound negative for acute pathology, report from radiologist shows negative for any signs of torsion.      Reexamination/ Reevaluation   Notes: Patient is doing well here.  She feels well.  She now states that she was treated for UTI few weeks ago with 2 different rounds of antibiotics including Macrobid.      Impression and Plan   Diagnosis   Pyelonephritis (ICD10-CM N12, Discharge, Medical)   Plan   Condition: Improved.    Disposition: Discharged: to home.    Prescriptions: Launch prescriptions   Pharmacy:  ondansetron 4 mg oral tablet (Prescribe): 4 mg, 1 tabs, Oral, q6hr, for 3 days, PRN: as needed for nausea/vomiting, 20 tabs, 0 Refill(s)  cefdinir 300 mg oral capsule (Prescribe): 300 mg, 1 caps, Oral, q12hr, for 10 days, 20 caps, 0 Refill(s)  Anaprox-DS 550 mg oral tablet (Prescribe): 550 mg, 1 tabs, Oral, BID, for 5 days, PRN: mild pain (1-3), 20 tabs, 0 Refill(s).    Patient was given the following educational materials: Pyelonephritis, Adult, Easy-to-Read.    Follow up with: Follow up with primary care provider Within 1 to 2 days Take the antibiotics as directed.  Drink plenty of fluids.  Please follow-up your primary doctor to find out the final urine culture results to make sure you are on the correct antibiotic.    Return to the ER immediately for any uncontrolled pain, new or worsening fever, uncontrolled vomiting, any other concerns.    Notes: Patient presents with left pelvis pain rating towards the left flank.  Her ultrasound was negative for cyst or ovarian torsion.  Labs show leukocytosis with clear UTI consistent with pyelonephritis.  She as it turns out was recently treated for UTI.  She was given IV Rocephin here will start her on  cephalosporin pending her urine culture results.  She looks well here and is reasonable for outpatient management.  She is here with her significant other who are comfortable with the plan and will be staying here locally for the next few days.  I advised her of pending urine culture results.

## 2020-03-10 LAB — CULTURE, URINE: FINAL REPORT: >100000
